# Patient Record
Sex: Female | Born: 1975 | Race: White | Hispanic: No | Smoking: Never smoker
Health system: Southern US, Community
[De-identification: ages and names within clinical notes are randomized; demographics above are authoritative.]

## PROBLEM LIST (undated history)

## (undated) DIAGNOSIS — E119 Type 2 diabetes mellitus without complications: Secondary | ICD-10-CM

---

## 1999-09-01 ENCOUNTER — Ambulatory Visit (HOSPITAL_COMMUNITY): Admission: RE | Admit: 1999-09-01 | Discharge: 1999-09-01 | Payer: Self-pay | Admitting: Family Medicine

## 1999-09-01 ENCOUNTER — Encounter: Payer: Self-pay | Admitting: Family Medicine

## 2003-08-25 ENCOUNTER — Other Ambulatory Visit: Admission: RE | Admit: 2003-08-25 | Discharge: 2003-08-25 | Payer: Self-pay | Admitting: Obstetrics and Gynecology

## 2004-08-26 ENCOUNTER — Other Ambulatory Visit: Admission: RE | Admit: 2004-08-26 | Discharge: 2004-08-26 | Payer: Self-pay | Admitting: Obstetrics and Gynecology

## 2006-02-22 ENCOUNTER — Ambulatory Visit (HOSPITAL_COMMUNITY): Admission: RE | Admit: 2006-02-22 | Discharge: 2006-02-22 | Payer: Self-pay | Admitting: Internal Medicine

## 2007-05-28 ENCOUNTER — Ambulatory Visit (HOSPITAL_COMMUNITY): Admission: RE | Admit: 2007-05-28 | Discharge: 2007-05-28 | Payer: Self-pay | Admitting: Gynecology

## 2009-08-06 ENCOUNTER — Observation Stay (HOSPITAL_COMMUNITY): Admission: AD | Admit: 2009-08-06 | Discharge: 2009-08-07 | Payer: Self-pay | Admitting: Obstetrics and Gynecology

## 2009-10-13 ENCOUNTER — Inpatient Hospital Stay (HOSPITAL_COMMUNITY): Admission: AD | Admit: 2009-10-13 | Discharge: 2009-10-16 | Payer: Self-pay | Admitting: Obstetrics and Gynecology

## 2009-10-20 ENCOUNTER — Encounter: Admission: RE | Admit: 2009-10-20 | Discharge: 2009-11-12 | Payer: Self-pay | Admitting: Obstetrics & Gynecology

## 2009-11-15 ENCOUNTER — Inpatient Hospital Stay (HOSPITAL_COMMUNITY): Admission: AD | Admit: 2009-11-15 | Discharge: 2009-11-15 | Payer: Self-pay | Admitting: Obstetrics and Gynecology

## 2009-11-15 ENCOUNTER — Ambulatory Visit: Payer: Self-pay | Admitting: Nurse Practitioner

## 2009-12-13 ENCOUNTER — Inpatient Hospital Stay (HOSPITAL_COMMUNITY): Admission: AD | Admit: 2009-12-13 | Discharge: 2009-12-13 | Payer: Self-pay | Admitting: Obstetrics & Gynecology

## 2009-12-17 ENCOUNTER — Inpatient Hospital Stay (HOSPITAL_COMMUNITY): Admission: AD | Admit: 2009-12-17 | Discharge: 2009-12-17 | Payer: Self-pay | Admitting: Obstetrics and Gynecology

## 2009-12-24 ENCOUNTER — Inpatient Hospital Stay (HOSPITAL_COMMUNITY): Admission: AD | Admit: 2009-12-24 | Discharge: 2009-12-26 | Payer: Self-pay | Admitting: Obstetrics and Gynecology

## 2010-03-06 ENCOUNTER — Encounter: Payer: Self-pay | Admitting: Obstetrics and Gynecology

## 2010-04-26 LAB — CBC
MCHC: 34.1 g/dL (ref 30.0–36.0)
MCHC: 34.8 g/dL (ref 30.0–36.0)
MCV: 101 fL — ABNORMAL HIGH (ref 78.0–100.0)
MCV: 102.1 fL — ABNORMAL HIGH (ref 78.0–100.0)
RBC: 3.56 MIL/uL — ABNORMAL LOW (ref 3.87–5.11)
RDW: 14.2 % (ref 11.5–15.5)
WBC: 19.7 10*3/uL — ABNORMAL HIGH (ref 4.0–10.5)

## 2010-04-26 LAB — GLUCOSE, CAPILLARY
Glucose-Capillary: 73 mg/dL (ref 70–99)
Glucose-Capillary: 93 mg/dL (ref 70–99)
Glucose-Capillary: 93 mg/dL (ref 70–99)

## 2010-04-26 LAB — ABO/RH: ABO/RH(D): O POS

## 2010-04-28 LAB — GLUCOSE, CAPILLARY
Glucose-Capillary: 150 mg/dL — ABNORMAL HIGH (ref 70–99)
Glucose-Capillary: 164 mg/dL — ABNORMAL HIGH (ref 70–99)
Glucose-Capillary: 180 mg/dL — ABNORMAL HIGH (ref 70–99)

## 2010-04-28 LAB — AMNISURE RUPTURE OF MEMBRANE (ROM) NOT AT ARMC: Amnisure ROM: NEGATIVE

## 2010-04-28 LAB — URINALYSIS, ROUTINE W REFLEX MICROSCOPIC
Glucose, UA: NEGATIVE mg/dL
Hgb urine dipstick: NEGATIVE
Ketones, ur: 40 mg/dL — AB
Nitrite: NEGATIVE
Urobilinogen, UA: 0.2 mg/dL (ref 0.0–1.0)

## 2010-04-29 LAB — CBC
Hemoglobin: 12.7 g/dL (ref 12.0–15.0)
MCHC: 34.2 g/dL (ref 30.0–36.0)
MCV: 100.6 fL — ABNORMAL HIGH (ref 78.0–100.0)
Platelets: 340 10*3/uL (ref 150–400)

## 2010-04-29 LAB — URINE CULTURE
Colony Count: 45000
Culture  Setup Time: 201109010051

## 2010-04-29 LAB — URINALYSIS, ROUTINE W REFLEX MICROSCOPIC
Ketones, ur: >80 mg/dL — AB
Protein, ur: NEGATIVE mg/dL
Specific Gravity, Urine: >1.03 — ABNORMAL HIGH (ref 1.005–1.030)
Urobilinogen, UA: 0.2 mg/dL (ref 0.0–1.0)
pH: 5.5 (ref 5.0–8.0)

## 2010-04-29 LAB — GLUCOSE, CAPILLARY: Glucose-Capillary: 296 mg/dL — ABNORMAL HIGH (ref 70–99)

## 2010-05-01 LAB — MRSA PCR SCREENING: MRSA by PCR: NEGATIVE

## 2012-07-05 ENCOUNTER — Other Ambulatory Visit (HOSPITAL_COMMUNITY)
Admission: RE | Admit: 2012-07-05 | Discharge: 2012-07-05 | Disposition: A | Discharge status: Home / Self Care | Payer: BC Managed Care – PPO | Source: Ambulatory Visit | Attending: Physician Assistant | Admitting: Physician Assistant

## 2012-07-05 ENCOUNTER — Other Ambulatory Visit: Payer: Self-pay | Admitting: Physician Assistant

## 2012-07-05 DIAGNOSIS — Z124 Encounter for screening for malignant neoplasm of cervix: Secondary | ICD-10-CM | POA: Insufficient documentation

## 2012-07-05 DIAGNOSIS — Z1151 Encounter for screening for human papillomavirus (HPV): Secondary | ICD-10-CM | POA: Insufficient documentation

## 2013-01-17 ENCOUNTER — Other Ambulatory Visit: Payer: Self-pay | Admitting: Physician Assistant

## 2013-01-17 ENCOUNTER — Other Ambulatory Visit (HOSPITAL_COMMUNITY)
Admission: RE | Admit: 2013-01-17 | Discharge: 2013-01-17 | Disposition: A | Discharge status: Home / Self Care | Payer: BC Managed Care – PPO | Source: Ambulatory Visit | Attending: Family Medicine | Admitting: Family Medicine

## 2013-01-17 DIAGNOSIS — R6889 Other general symptoms and signs: Secondary | ICD-10-CM | POA: Insufficient documentation

## 2013-06-20 ENCOUNTER — Other Ambulatory Visit: Payer: Self-pay | Admitting: Family Medicine

## 2013-06-20 DIAGNOSIS — N949 Unspecified condition associated with female genital organs and menstrual cycle: Secondary | ICD-10-CM

## 2013-06-26 ENCOUNTER — Encounter (INDEPENDENT_AMBULATORY_CARE_PROVIDER_SITE_OTHER): Payer: Self-pay

## 2013-06-26 ENCOUNTER — Ambulatory Visit
Admission: RE | Admit: 2013-06-26 | Discharge: 2013-06-26 | Disposition: A | Discharge status: Home / Self Care | Payer: BC Managed Care – PPO | Source: Ambulatory Visit | Attending: Family Medicine | Admitting: Family Medicine

## 2013-06-26 DIAGNOSIS — N949 Unspecified condition associated with female genital organs and menstrual cycle: Secondary | ICD-10-CM

## 2013-07-01 ENCOUNTER — Other Ambulatory Visit: Payer: Self-pay | Admitting: Physician Assistant

## 2013-07-01 DIAGNOSIS — R52 Pain, unspecified: Secondary | ICD-10-CM

## 2013-07-04 ENCOUNTER — Other Ambulatory Visit: Payer: BC Managed Care – PPO

## 2013-07-18 ENCOUNTER — Other Ambulatory Visit: Payer: Self-pay | Admitting: Physician Assistant

## 2013-07-18 ENCOUNTER — Other Ambulatory Visit (HOSPITAL_COMMUNITY)
Admission: RE | Admit: 2013-07-18 | Discharge: 2013-07-18 | Disposition: A | Discharge status: Home / Self Care | Payer: BC Managed Care – PPO | Source: Ambulatory Visit | Attending: Family Medicine | Admitting: Family Medicine

## 2013-07-18 DIAGNOSIS — Z124 Encounter for screening for malignant neoplasm of cervix: Secondary | ICD-10-CM | POA: Insufficient documentation

## 2013-07-23 LAB — CYTOLOGY - PAP

## 2013-08-25 ENCOUNTER — Other Ambulatory Visit: Payer: BC Managed Care – PPO

## 2013-08-29 ENCOUNTER — Other Ambulatory Visit: Payer: BC Managed Care – PPO

## 2014-01-05 ENCOUNTER — Ambulatory Visit
Admission: RE | Admit: 2014-01-05 | Discharge: 2014-01-05 | Disposition: A | Discharge status: Home / Self Care | Payer: BC Managed Care – PPO | Source: Ambulatory Visit | Attending: Physician Assistant | Admitting: Physician Assistant

## 2014-01-05 ENCOUNTER — Other Ambulatory Visit: Payer: Self-pay | Admitting: Physician Assistant

## 2014-01-05 DIAGNOSIS — R52 Pain, unspecified: Secondary | ICD-10-CM

## 2015-08-22 IMAGING — CR DG ELBOW COMPLETE 3+V*R*
4 series · 4 of 4 positions shown · non-contrast
Comparison: None.

CLINICAL DATA: Right elbow pain for 3 weeks. No known injury. 2
small not this over the olecranon, initial encounter.

EXAM:
RIGHT ELBOW - COMPLETE 3+ VIEW

[x elbow joint ap right]
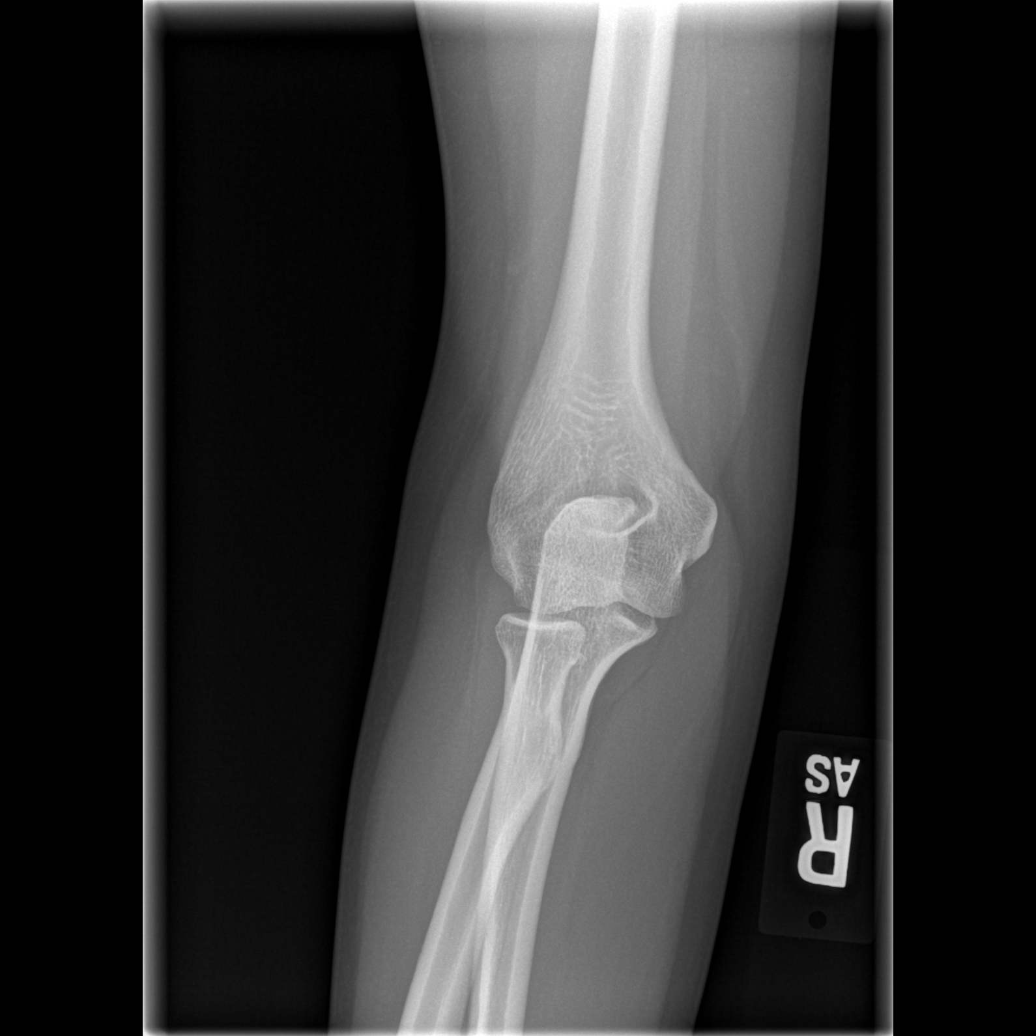

[x elbow joint obl. right (1 of 2)]
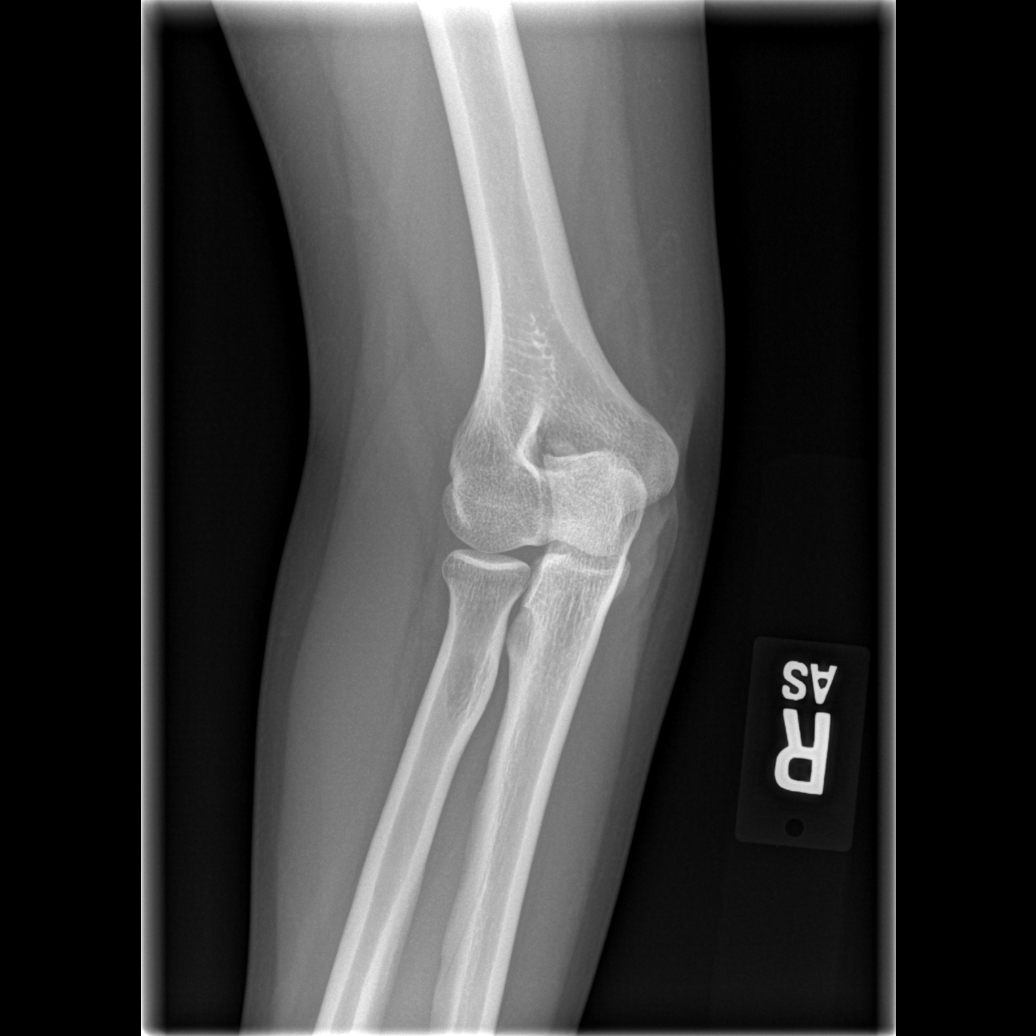

[x elbow joint obl. right (2 of 2)]
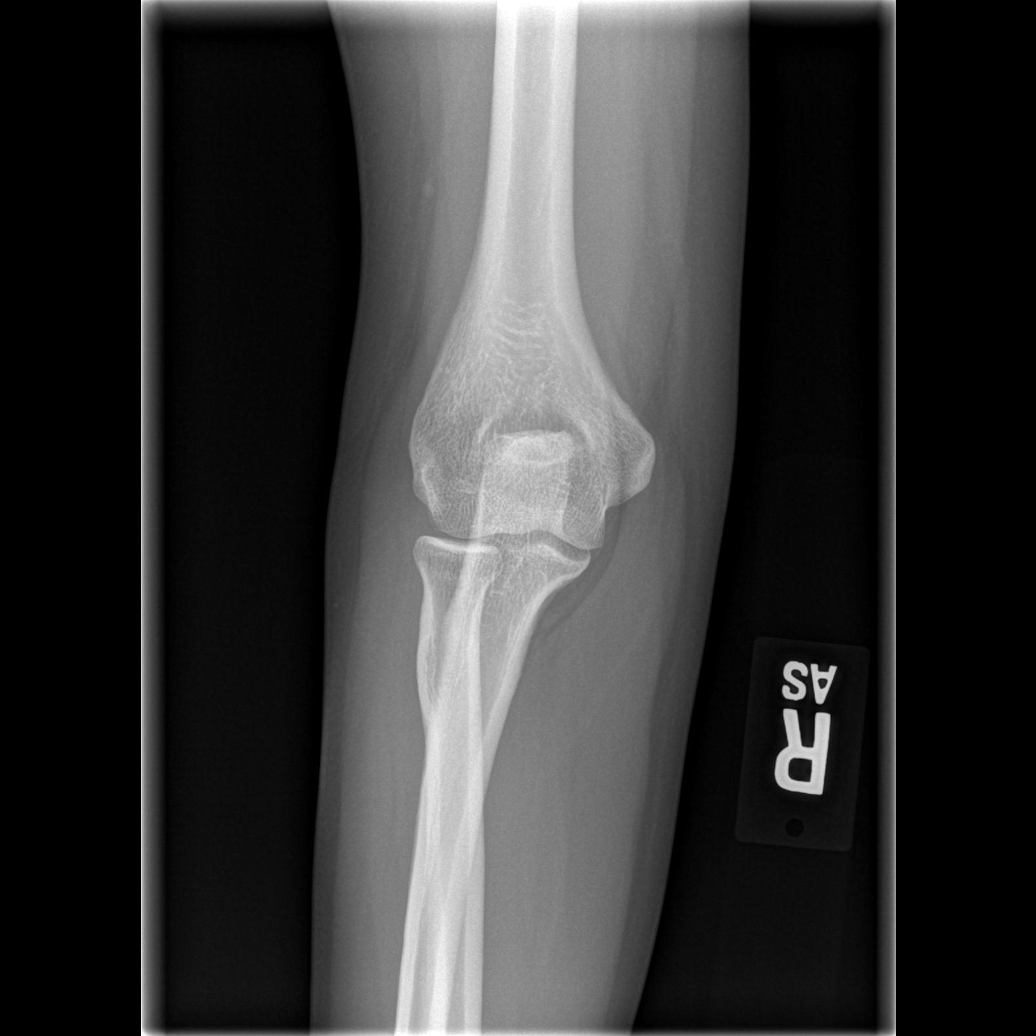

[x elbow joint lat right]
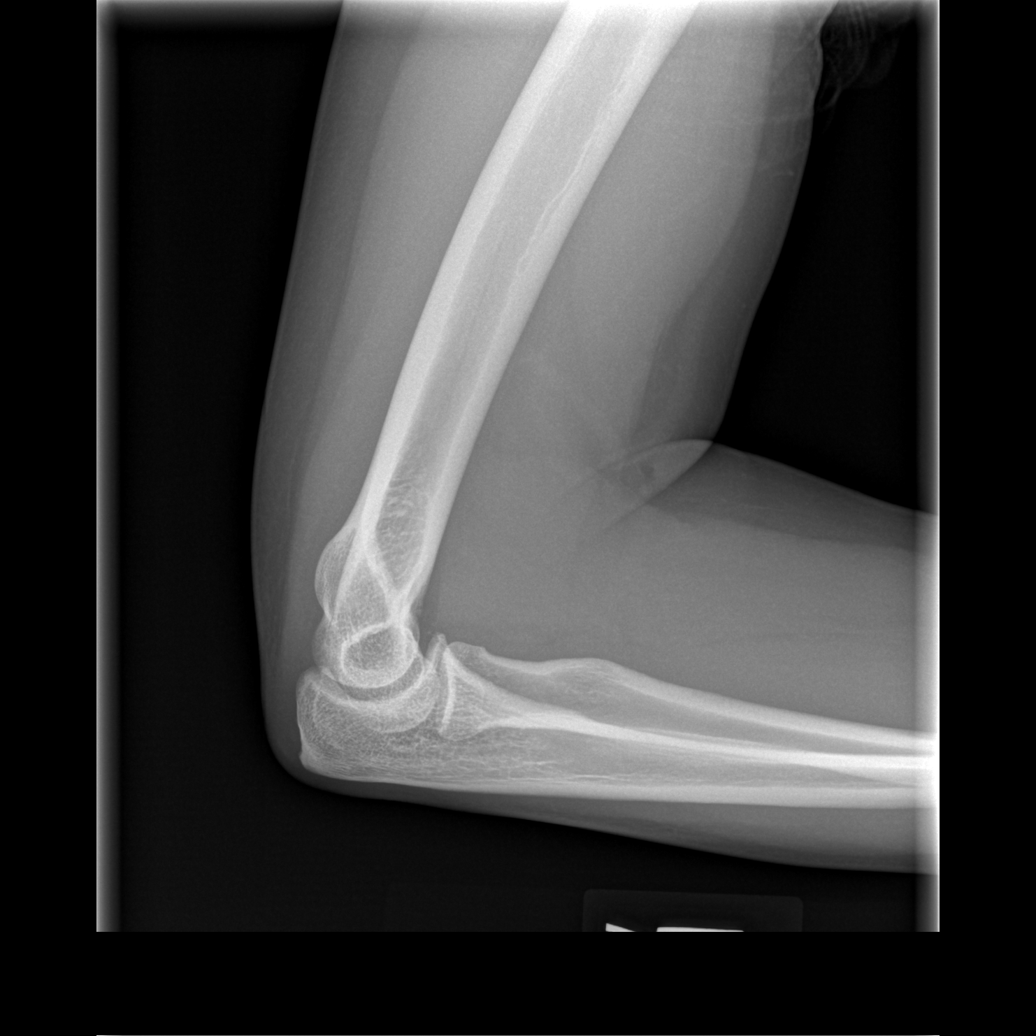

[4 of 4 positions shown; findings below may reference images not displayed]

FINDINGS: There is no evidence of fracture, dislocation, or joint effusion.
There is no evidence of arthropathy or other focal bone abnormality.
Soft tissues are unremarkable.
IMPRESSION: Negative.

## 2019-02-03 ENCOUNTER — Emergency Department (INDEPENDENT_AMBULATORY_CARE_PROVIDER_SITE_OTHER)
Admission: EM | Admit: 2019-02-03 | Discharge: 2019-02-03 | Disposition: A | Discharge status: Home / Self Care | Payer: BC Managed Care – PPO | Source: Home / Self Care

## 2019-02-03 ENCOUNTER — Encounter: Payer: Self-pay | Admitting: Family Medicine

## 2019-02-03 ENCOUNTER — Other Ambulatory Visit: Payer: Self-pay

## 2019-02-03 DIAGNOSIS — R0789 Other chest pain: Secondary | ICD-10-CM

## 2019-02-03 HISTORY — DX: Type 2 diabetes mellitus without complications: E11.9

## 2019-02-03 MED ORDER — HYDROCODONE-ACETAMINOPHEN 5-325 MG PO TABS: 1.0000 | ORAL_TABLET | Freq: Every evening | ORAL | 0 refills | Status: AC | PRN

## 2019-02-03 NOTE — ED Triage Notes (Signed)
Fell on back on hard wood yesterday, hurt ribs, upper back, rt abdomen, neck

## 2019-02-03 NOTE — Discharge Instructions (Addendum)
There are no signs of a broken rib or collapsed lung.  At this point, your symptoms are consistent with bruised ribs and strained ribs.

## 2019-02-03 NOTE — ED Provider Notes (Signed)
[facility]    CSN: [id number] Arrival date & time: 02/03/19  1743      History   Chief Complaint Chief Complaint  Patient presents with  . Fall    HPI Jessica Pratt is a 43 y.o. female.   Initial [facility] visit.  Patient is diabetic and complaining of neck, rib, and abdominal pain.  Last night patient was adjusting volume on TV set above mantel at brother's house when the mantel came loose and she fell backwards flat onto hardwood floor.    Didn't sleep well, and today has pain with deep breath, cough, or sneeze.  No shortness of breath or fever.  Pain felt in sternum, thoracic and cervical spine.       Past Medical History:  Diagnosis Date  . Diabetes mellitus without complication (HCC)     There are no problems to display for this patient.   History reviewed. No pertinent surgical history.  OB History   No obstetric history on file.      Home Medications    Prior to Admission medications   Medication Sig Start Date End Date Taking? Authorizing Provider  busPIRone (BUSPAR) 10 MG tablet Take 10 mg by mouth 3 (three) times daily.   Yes [provider]  escitalopram (LEXAPRO) 10 MG tablet Take 10 mg by mouth daily.   Yes [provider]  glipiZIDE (GLUCOTROL) 10 MG tablet Take 10 mg by mouth daily before breakfast.   Yes [provider]  HYDROcodone-acetaminophen (NORCO) 5-325 MG tablet Take 1 tablet by mouth at bedtime and may repeat dose one time if needed. 02/03/19   Elvina Sidle, MD    Family History Family History  Problem Relation Age of Onset  . Diabetes Mother   . Healthy Father     Social History Social History   Tobacco Use  . Smoking status: Never Smoker  . Smokeless tobacco: Never Used  Substance Use Topics  . Alcohol use: Not Currently  . Drug use: Not on file     Allergies   Sulfa antibiotics   Review of Systems Review of Systems   Physical Exam Triage Vital Signs ED Triage Vitals    Enc Vitals Group     BP      Pulse      Resp      Temp      Temp src      SpO2      Weight      Height      Head Circumference      Peak Flow      Pain Score      Pain Loc      Pain Edu?      Excl. in GC?    No data found.  Updated Vital Signs BP (!) 146/97 (BP Location: Right Arm)   Pulse (!) 107   Temp 98.3 F (36.8 C) (Oral)   Ht 5\' 3"  (1.6 m)   Wt 68 kg   SpO2 98%   BMI 26.57 kg/m    Physical Exam Vitals and nursing note reviewed.  Constitutional:      General: She is not in acute distress.    Appearance: Normal appearance. She is not ill-appearing.  HENT:     Head: Normocephalic.  Eyes:     Conjunctiva/sclera: Conjunctivae normal.  Cardiovascular:     Rate and Rhythm: Normal rate.     Pulses: Normal pulses.  Pulmonary:     Effort: Pulmonary effort  is normal.     Breath sounds: Normal breath sounds.  Musculoskeletal:        General: Normal range of motion.     Cervical back: Normal range of motion and neck supple. No tenderness.  Skin:    General: Skin is warm and dry.     Findings: No bruising.  Neurological:     General: No focal deficit present.     Mental Status: She is alert.  Psychiatric:        Mood and Affect: Mood normal.      UC Treatments / Results  Labs (all labs ordered are listed, but only abnormal results are displayed) Labs Reviewed - No data to display  EKG   Radiology No results found.  Procedures Procedures (including critical care time)  Medications Ordered in UC Medications - No data to display  Initial Impression / Assessment and Plan / UC Course  I have reviewed the triage vital signs and the nursing notes.  Pertinent labs & imaging results that were available during my care of the patient were reviewed by me and considered in my medical decision making (see chart for details).    Final Clinical Impressions(s) / UC Diagnoses   Final diagnoses:  Chest wall pain     Discharge Instructions     There  are no signs of a broken rib or collapsed lung.  At this point, your symptoms are consistent with bruised ribs and strained ribs.    ED Prescriptions    Medication Sig Dispense Auth. Provider   HYDROcodone-acetaminophen (NORCO) 5-325 MG tablet Take 1 tablet by mouth at bedtime and may repeat dose one time if needed. 15 tablet Robyn Haber, MD     I have reviewed the PDMP during this encounter.   Robyn Haber, MD 02/03/19 1819

## 2021-01-31 ENCOUNTER — Other Ambulatory Visit: Payer: Self-pay

## 2021-01-31 ENCOUNTER — Emergency Department
Admission: EM | Admit: 2021-01-31 | Discharge: 2021-01-31 | Disposition: A | Discharge status: Home / Self Care | Payer: BC Managed Care – PPO | Source: Home / Self Care

## 2021-01-31 ENCOUNTER — Emergency Department: Admit: 2021-01-31 | Discharge status: Absent without leave | Payer: BC Managed Care – PPO | Source: Home / Self Care

## 2021-01-31 DIAGNOSIS — U071 COVID-19: Secondary | ICD-10-CM

## 2021-01-31 LAB — POC SARS CORONAVIRUS 2 AG -  ED: SARS Coronavirus 2 Ag: POSITIVE — AB

## 2021-01-31 LAB — POC INFLUENZA A AND B ANTIGEN (URGENT CARE ONLY)
Influenza A Ag: NEGATIVE
Influenza B Ag: NEGATIVE

## 2021-01-31 MED ORDER — BENZONATATE 100 MG PO CAPS: 100.0000 mg | ORAL_CAPSULE | Freq: Three times a day (TID) | ORAL | 0 refills | Status: AC

## 2021-01-31 NOTE — Discharge Instructions (Signed)
Positive for COVID. Take Tessalon as prescribed to help with cough. Take prednisone as prescribed to help with chest tightness - this may elevate your blood sugar so monitor. Continue with OTC medicine as needed as well as resting and keeping hydrated. Self-isolate for 5 days from the start of symptoms then make sure to mask around others for the following 5 days.  Follow-up with PCP if no improvement in a week. Go to the ER if develop difficulty breathing.

## 2021-01-31 NOTE — ED Provider Notes (Signed)
[facility]    CSN: [id number] Arrival date & time: 01/31/21  0951      History   Chief Complaint Chief Complaint  Patient presents with   Generalized Body Aches   Fever   Cough   Nasal Congestion    HPI Jessica Pratt is a 45 y.o. female.   Patient presents with concerns of feeling unwell since Friday. She reports headache, body aches, nasal congestion, mild sore throat, cough, and diarrhea. She reports one episode of nausea but attributes this to having on too many layers while walking around outside. The patient denies known fever but has had sweats at night. She reports some chest discomfort with coughing and taking a deep breath and sometimes feeling like she has to work harder to get a good breath when laying down. She denies history of asthma or COPD. The patient has taken OTC medicines with mild improvement. She denies known sick contacts except a coworker with the flu two weeks ago.   The history is provided by the patient.  Fever Associated symptoms: congestion, cough, diarrhea, headaches, myalgias, nausea, rhinorrhea and sore throat   Associated symptoms: no chest pain, no ear pain, no rash and no vomiting   Cough Associated symptoms: diaphoresis, headaches, myalgias, rhinorrhea and sore throat   Associated symptoms: no chest pain, no ear pain, no fever, no rash and no shortness of breath    Past Medical History:  Diagnosis Date   Diabetes mellitus without complication (HCC)     There are no problems to display for this patient.   History reviewed. No pertinent surgical history.  OB History   No obstetric history on file.      Home Medications    Prior to Admission medications   Medication Sig Start Date End Date Taking? Authorizing Provider  benzonatate (TESSALON) 100 MG capsule Take 1 capsule (100 mg total) by mouth every 8 (eight) hours. 01/31/21  Yes Deuntae Kocsis L, PA  Dulaglutide (TRULICITY [place]) Inject into the skin.   Yes [provider]  busPIRone (BUSPAR) 10 MG tablet Take 10 mg by mouth 3 (three) times daily.    [provider]  escitalopram (LEXAPRO) 10 MG tablet Take 10 mg by mouth daily.    [provider]  glipiZIDE (GLUCOTROL) 10 MG tablet Take 10 mg by mouth daily before breakfast.    [provider]  HYDROcodone-acetaminophen (NORCO) 5-325 MG tablet Take 1 tablet by mouth at bedtime and may repeat dose one time if needed. Patient not taking: Reported on 01/31/2021 02/03/19   Elvina Sidle, MD    Family History Family History  Problem Relation Age of Onset   Diabetes Mother    Healthy Father     Social History Social History   Tobacco Use   Smoking status: Never   Smokeless tobacco: Never  Vaping Use   Vaping Use: Never used  Substance Use Topics   Alcohol use: Not Currently     Allergies   Sulfa antibiotics   Review of Systems Review of Systems  Constitutional:  Positive for diaphoresis and fatigue. Negative for fever.  HENT:  Positive for congestion, rhinorrhea and sore throat. Negative for ear pain.   Eyes:  Negative for redness.  Respiratory:  Positive for cough and chest tightness. Negative for shortness of breath.   Cardiovascular:  Negative for chest pain.  Gastrointestinal:  Positive for diarrhea and nausea. Negative for abdominal pain and vomiting.  Musculoskeletal:  Positive for myalgias.  Skin:  Negative for rash.  Neurological:  Positive for headaches. Negative for dizziness.    Physical Exam Triage Vital Signs ED Triage Vitals  Enc Vitals Group     BP 01/31/21 1000 (!) 136/103     Pulse Rate 01/31/21 1000 97     Resp 01/31/21 1000 14     Temp 01/31/21 1000 98.7 F (37.1 C)     Temp Source 01/31/21 1000 Oral     SpO2 01/31/21 1000 99 %     Weight --      Height --      Head Circumference --      Peak Flow --      Pain Score 01/31/21 1002 4     Pain Loc --      Pain Edu? --      Excl. in GC? --    No data  found.  Updated Vital Signs BP (!) 136/103 (BP Location: Right Arm)    Pulse 97    Temp 98.7 F (37.1 C) (Oral)    Resp 14    SpO2 99%   Visual Acuity Right Eye Distance:   Left Eye Distance:   Bilateral Distance:    Right Eye Near:   Left Eye Near:    Bilateral Near:     Physical Exam Vitals and nursing note reviewed.  Constitutional:      General: She is not in acute distress. HENT:     Head: Normocephalic.     Nose: Congestion present. No rhinorrhea.     Mouth/Throat:     Mouth: Mucous membranes are moist.     Pharynx: Oropharynx is clear.  Eyes:     Conjunctiva/sclera: Conjunctivae normal.     Pupils: Pupils are equal, round, and reactive to light.  Cardiovascular:     Rate and Rhythm: Normal rate and regular rhythm.     Heart sounds: Normal heart sounds.  Pulmonary:     Effort: Pulmonary effort is normal.     Breath sounds: Normal breath sounds.  Musculoskeletal:     Cervical back: Normal range of motion.  Skin:    Findings: No rash.  Neurological:     Mental Status: She is alert.     Gait: Gait normal.  Psychiatric:        Mood and Affect: Mood normal.     UC Treatments / Results  Labs (all labs ordered are listed, but only abnormal results are displayed) Labs Reviewed  POC SARS CORONAVIRUS 2 AG -  ED - Abnormal; Notable for the following components:      Result Value   SARS Coronavirus 2 Ag Positive (*)    All other components within normal limits  POC INFLUENZA A AND B ANTIGEN (URGENT CARE ONLY)    EKG   Radiology No results found.  Procedures Procedures (including critical care time)  Medications Ordered in UC Medications - No data to display  Initial Impression / Assessment and Plan / UC Course  I have reviewed the triage vital signs and the nursing notes.  Pertinent labs & imaging results that were available during my care of the patient were reviewed by me and considered in my medical decision making (see chart for details).      Pos COVID, not at high risk for complications. Sx tx and reassurance. Prednisone to help with chest tightness, no hx of asthma. Return and ER precautions discussed.   E/M: 1 acute uncomplicated illness, 2 data (flu, covid), moderate risk due to prescription  management  Final Clinical Impressions(s) / UC Diagnoses   Final diagnoses:  COVID     Discharge Instructions      Positive for COVID. Take Tessalon as prescribed to help with cough. Take prednisone as prescribed to help with chest tightness - this may elevate your blood sugar so monitor. Continue with OTC medicine as needed as well as resting and keeping hydrated. Self-isolate for 5 days from the start of symptoms then make sure to mask around others for the following 5 days.  Follow-up with PCP if no improvement in a week. Go to the ER if develop difficulty breathing.     ED Prescriptions     Medication Sig Dispense Auth. Provider   benzonatate (TESSALON) 100 MG capsule Take 1 capsule (100 mg total) by mouth every 8 (eight) hours. 21 capsule Vallery Sa, Kenny Stern L, PA      PDMP not reviewed this encounter.   Estanislado Pandy, [place] 01/31/21 1039

## 2021-01-31 NOTE — ED Triage Notes (Signed)
Pt presents with cough congestion, body aches, fever, diarrhea that began Friday. Pt also states she has pain in her chest with breathing, Negative home covid test
